# Patient Record
Sex: Male | Born: 1985 | Race: Black or African American | Hispanic: No | Marital: Single | Smoking: Current every day smoker
Health system: Southern US, Community
[De-identification: ages and names within clinical notes are randomized; demographics above are authoritative.]

## PROBLEM LIST (undated history)

## (undated) DIAGNOSIS — F329 Major depressive disorder, single episode, unspecified: Secondary | ICD-10-CM

## (undated) DIAGNOSIS — F32A Depression, unspecified: Secondary | ICD-10-CM

## (undated) DIAGNOSIS — N289 Disorder of kidney and ureter, unspecified: Secondary | ICD-10-CM

## (undated) DIAGNOSIS — I82409 Acute embolism and thrombosis of unspecified deep veins of unspecified lower extremity: Secondary | ICD-10-CM

## (undated) DIAGNOSIS — I10 Essential (primary) hypertension: Secondary | ICD-10-CM

## (undated) DIAGNOSIS — D573 Sickle-cell trait: Secondary | ICD-10-CM

## (undated) HISTORY — PX: COLON SURGERY: SHX602

## (undated) HISTORY — PX: CHOLECYSTECTOMY: SHX55

## (undated) HISTORY — PX: RENAL BIOPSY: SHX156

---

## 2018-02-07 ENCOUNTER — Ambulatory Visit: Payer: Medicaid Other

## 2018-02-07 ENCOUNTER — Encounter: Payer: Self-pay | Admitting: Gynecology

## 2018-02-07 ENCOUNTER — Ambulatory Visit
Admission: EM | Admit: 2018-02-07 | Discharge: 2018-02-07 | Disposition: A | Discharge status: Home / Self Care | Payer: Medicaid Other | Attending: Family Medicine | Admitting: Family Medicine

## 2018-02-07 ENCOUNTER — Other Ambulatory Visit: Payer: Self-pay

## 2018-02-07 DIAGNOSIS — W1839XA Other fall on same level, initial encounter: Secondary | ICD-10-CM | POA: Diagnosis not present

## 2018-02-07 DIAGNOSIS — L02811 Cutaneous abscess of head [any part, except face]: Secondary | ICD-10-CM | POA: Diagnosis not present

## 2018-02-07 DIAGNOSIS — W19XXXA Unspecified fall, initial encounter: Secondary | ICD-10-CM | POA: Insufficient documentation

## 2018-02-07 DIAGNOSIS — M25562 Pain in left knee: Secondary | ICD-10-CM | POA: Diagnosis present

## 2018-02-07 DIAGNOSIS — S83422A Sprain of lateral collateral ligament of left knee, initial encounter: Secondary | ICD-10-CM

## 2018-02-07 DIAGNOSIS — F17200 Nicotine dependence, unspecified, uncomplicated: Secondary | ICD-10-CM | POA: Insufficient documentation

## 2018-02-07 HISTORY — DX: Essential (primary) hypertension: I10

## 2018-02-07 HISTORY — DX: Acute embolism and thrombosis of unspecified deep veins of unspecified lower extremity: I82.409

## 2018-02-07 HISTORY — DX: Sickle-cell trait: D57.3

## 2018-02-07 HISTORY — DX: Disorder of kidney and ureter, unspecified: N28.9

## 2018-02-07 HISTORY — DX: Depression, unspecified: F32.A

## 2018-02-07 HISTORY — DX: Major depressive disorder, single episode, unspecified: F32.9

## 2018-02-07 MED ORDER — MELOXICAM 15 MG PO TABS: 15.0000 mg | ORAL_TABLET | Freq: Every day | ORAL | 0 refills | Status: AC | PRN

## 2018-02-07 MED ORDER — DOXYCYCLINE HYCLATE 100 MG PO CAPS: 100.0000 mg | ORAL_CAPSULE | Freq: Two times a day (BID) | ORAL | 0 refills | Status: DC

## 2018-02-07 NOTE — ED Provider Notes (Addendum)
[facility]    CSN: [id number] Arrival date & time: 02/07/18  1719  History   Chief Complaint Chief Complaint  Patient presents with  . Knee Injury  . Abscess   HPI  32 year old male presents with 2 separate complaints.  Patient reports that he was in the street last night.  He fell and injured his left knee.  He reports that he felt a pop.  His pain is located laterally.  Worse with activity.  No relieving factors.  Additionally, patient reports an ongoing abscess to the right posterior aspect of his scalp.  Has a history of dissecting cellulitis of the scalp.  Has been seen previously by dermatology.  States that this particular abscess has been present for the past several days.  Area is tender and painful.  Patient would like it drained today.  Social Hx reviewed and updated as below. Social History Social History   Tobacco Use  . Smoking status: Current Every Day Smoker  . Smokeless tobacco: Never Used  Substance Use Topics  . Alcohol use: Yes  . Drug use: Never     Allergies   Patient has no known allergies.   Review of Systems Review of Systems  Musculoskeletal:       Left knee pain.  Skin:       Abscess.    Physical Exam Triage Vital Signs ED Triage Vitals  Enc Vitals Group     BP 02/07/18 1734 115/69     Pulse Rate 02/07/18 1734 100     Resp 02/07/18 1734 16     Temp 02/07/18 1734 98.9 F (37.2 C)     Temp Source 02/07/18 1734 Oral     SpO2 02/07/18 1734 100 %     Weight 02/07/18 1733 186 lb (84.4 kg)     Height 02/07/18 1733 6\' 1"  (1.854 m)     Head Circumference --      Peak Flow --      Pain Score 02/07/18 1732 9     Pain Loc --      Pain Edu? --      Excl. in GC? --    Updated Vital Signs BP 115/69 (BP Location: Left Arm)   Pulse 100   Temp 98.9 F (37.2 C) (Oral)   Resp 16   Ht 6\' 1"  (1.854 m)   Wt 84.4 kg   SpO2 100%   BMI 24.54 kg/m   Visual Acuity Right Eye Distance:   Left Eye Distance:   Bilateral  Distance:    Right Eye Near:   Left Eye Near:    Bilateral Near:     Physical Exam  Constitutional: He is oriented to person, place, and time. He appears well-developed. No distress.  HENT:  Head: Normocephalic and atraumatic.    1.5 cm circumferential raised area with fluctuance noted at the labeled location.  Eyes: Conjunctivae are normal. Right eye exhibits no discharge. Left eye exhibits no discharge.  Pulmonary/Chest: Effort normal. No respiratory distress.  Musculoskeletal:  Left knee - Lateral tenderness. No effusion. Ligaments intact.  Neurological: He is alert and oriented to person, place, and time.  Psychiatric: His behavior is normal.  Flat affect.  Nursing note and vitals reviewed.  UC Treatments / Results  Labs (all labs ordered are listed, but only abnormal results are displayed) Labs Reviewed - No data to display  EKG None  Radiology No results found.  Procedures Incision and Drainage Date/Time: 02/07/2018 6:25 PM Performed by: Adriana Simas,  Verdis Frederickson, DO Authorized by: Tommie Sams, DO   Consent:    Consent obtained:  Verbal   Consent given by:  Patient Location:    Type:  Abscess   Size:  1.5 cm   Location:  Head   Head location:  Scalp Pre-procedure details:    Skin preparation:  Betadine Anesthesia (see MAR for exact dosages):    Anesthesia method:  Local infiltration   Local anesthetic:  Lidocaine 1% WITH epi Procedure type:    Complexity:  Simple Procedure details:    Incision types:  Stab incision   Scalpel blade:  11   Drainage:  Purulent   Drainage amount:  Moderate   Wound treatment:  Wound left open   Packing materials:  None Post-procedure details:    Patient tolerance of procedure:  Tolerated well, no immediate complications   (including critical care time)  Medications Ordered in UC Medications - No data to display  Initial Impression / Assessment and Plan / UC Course  I have reviewed the triage vital signs and the nursing  notes.  Pertinent labs & imaging results that were available during my care of the patient were reviewed by me and considered in my medical decision making (see chart for details).    32 year old male presents with scalp abscess/dissecting cellulitis of the scalp, and LCL sprain.  X-ray negative.  Treating with meloxicam and doxycycline.  Incision and drainage performed as above.  Advised to follow-up with dermatology.  Is likely going to be a candidate for Accutane.  Final Clinical Impressions(s) / UC Diagnoses   Final diagnoses:  Scalp abscess  Sprain of lateral collateral ligament of left knee, initial encounter     Discharge Instructions     Knee xray normal. Medication as prescribed.  Doxy as prescribed.  Follow up with Dermatology.  Take care  Dr. Adriana Simas     ED Prescriptions    Medication Sig Dispense Auth. Provider   doxycycline (VIBRAMYCIN) 100 MG capsule Take 1 capsule (100 mg total) by mouth 2 (two) times daily. Take with food. 60 capsule Desirae Mancusi G, DO   meloxicam (MOBIC) 15 MG tablet Take 1 tablet (15 mg total) by mouth daily as needed. 30 tablet Tommie Sams, DO     Controlled Substance Prescriptions [place] Controlled Substance Registry consulted? Not Applicable   Tommie Sams, DO 02/07/18 1824    Tommie Sams, DO 02/07/18 Claria Dice

## 2018-02-07 NOTE — ED Triage Notes (Signed)
Per patient x yesterday slip and fell in drainage at sidewalk . Pt. Stated he felt  his left knee popped. Patient also c/o abscess at side of his right head.

## 2018-02-07 NOTE — Discharge Instructions (Signed)
Knee xray normal. Medication as prescribed.  Doxy as prescribed.  Follow up with Dermatology.  Take care  Dr. Adriana Simas

## 2018-04-21 ENCOUNTER — Ambulatory Visit
Admission: EM | Admit: 2018-04-21 | Discharge: 2018-04-21 | Disposition: A | Discharge status: Home / Self Care | Payer: Medicaid Other | Attending: Emergency Medicine | Admitting: Emergency Medicine

## 2018-04-21 ENCOUNTER — Other Ambulatory Visit: Payer: Self-pay

## 2018-04-21 ENCOUNTER — Encounter: Payer: Self-pay | Admitting: Gynecology

## 2018-04-21 DIAGNOSIS — M5412 Radiculopathy, cervical region: Secondary | ICD-10-CM | POA: Insufficient documentation

## 2018-04-21 MED ORDER — TIZANIDINE HCL 4 MG PO CAPS: 4.0000 mg | ORAL_CAPSULE | Freq: Three times a day (TID) | ORAL | 0 refills | Status: AC

## 2018-04-21 MED ORDER — METHYLPREDNISOLONE 4 MG PO TBPK: ORAL_TABLET | ORAL | 0 refills | Status: DC

## 2018-04-21 NOTE — ED Provider Notes (Signed)
[facility]    CSN: [id number] Arrival date & time: 04/21/18  1547     History   Chief Complaint No chief complaint on file.   HPI Doris CheadleFranklin Bloodsworth is a 33 y.o. male.   HPI    33 year old male presents with right-sided neck pain radiating into his shoulder and his right upper extremity.  States he had it for months.  He thinks it occurred when he was at [facility] and they stuck him 7 times to get an IV started.  It hurts to move his arm.  He does not complain of any numbness or tingling.           Past Medical History:  Diagnosis Date  . Depression   . DVT (deep venous thrombosis) (HCC)   . Hypertension   . Renal disorder   . Sickle cell trait (HCC)     There are no active problems to display for this patient.   Past Surgical History:  Procedure Laterality Date  . CHOLECYSTECTOMY    . COLON SURGERY    . RENAL BIOPSY         Home Medications    Prior to Admission medications   Medication Sig Start Date End Date Taking? Authorizing Provider  FLUoxetine (PROZAC) 10 MG capsule TAKE ONE CAPSULE BY MOUTH EVERY DAY FOR 7 DAYS AND THEN TAKE TWO CAPSULES EVERY DAY THEREAFTER 01/21/18  Yes [provider]  mirtazapine (REMERON) 15 MG tablet TAKE ONE TABLET BY MOUTH EVERY NIGHT AT BEDTIME AS NEEDED FOR INSOMNIA 01/21/18  Yes [provider]  SUBOXONE 8-2 MG FILM DISSOLVE ONE FILM UNDER THE TONGUE THREE TIMES A DAY 01/21/18  Yes [provider]  meloxicam (MOBIC) 15 MG tablet Take 1 tablet (15 mg total) by mouth daily as needed. 02/07/18   Tommie Samsook, Jayce G, DO  methylPREDNISolone (MEDROL DOSEPAK) 4 MG TBPK tablet Take per package instructions 04/21/18   Lutricia Feiloemer, Kameshia Madruga P, PA-C  tiZANidine (ZANAFLEX) 4 MG capsule Take 1 capsule (4 mg total) by mouth 3 (three) times daily. 04/21/18   Lutricia Feiloemer, Laquinta Hazell P, PA-C    Family History Family History  Problem Relation Age of Onset  . Hypertension Mother   . Asthma Father     Social  History Social History   Tobacco Use  . Smoking status: Current Every Day Smoker  . Smokeless tobacco: Never Used  Substance Use Topics  . Alcohol use: Yes  . Drug use: Never     Allergies   Tomato   Review of Systems Review of Systems  Constitutional: Positive for activity change. Negative for appetite change, chills, fatigue and fever.  Musculoskeletal: Positive for neck pain and neck stiffness.  All other systems reviewed and are negative.    Physical Exam Triage Vital Signs ED Triage Vitals  Enc Vitals Group     BP 04/21/18 1605 106/66     Pulse Rate 04/21/18 1605 96     Resp 04/21/18 1605 16     Temp 04/21/18 1605 98.1 F (36.7 C)     Temp Source 04/21/18 1605 Oral     SpO2 04/21/18 1605 98 %     Weight 04/21/18 1604 200 lb (90.7 kg)     Height 04/21/18 1604 6\' 1"  (1.854 m)     Head Circumference --      Peak Flow --      Pain Score 04/21/18 1604 10     Pain Loc --      Pain Edu? --  Excl. in GC? --    No data found.  Updated Vital Signs BP 106/66 (BP Location: Left Arm)   Pulse 96   Temp 98.1 F (36.7 C) (Oral)   Resp 16   Ht 6\' 1"  (1.854 m)   Wt 200 lb (90.7 kg)   SpO2 98%   BMI 26.39 kg/m   Visual Acuity Right Eye Distance:   Left Eye Distance:   Bilateral Distance:    Right Eye Near:   Left Eye Near:    Bilateral Near:     Physical Exam Vitals signs and nursing note reviewed.  Constitutional:      General: He is not in acute distress.    Appearance: Normal appearance. He is normal weight. He is not ill-appearing, toxic-appearing or diaphoretic.  HENT:     Head: Normocephalic.     Nose: Nose normal.     Mouth/Throat:     Mouth: Mucous membranes are moist.     Pharynx: No oropharyngeal exudate or posterior oropharyngeal erythema.  Eyes:     General:        Right eye: No discharge.        Left eye: No discharge.     Conjunctiva/sclera: Conjunctivae normal.  Neck:     Musculoskeletal: Muscular tenderness present.      Comments: Patient has decreased range of motion tickly with flexion extension and rightward rotation.  Tenderness to palpation of the trapezii with a palpable muscle spasm.  This extend into the intrascapular area on the right.  Her extremity strength and sensation are intact to light touch and clinical resistance.  DTRs are 1/4 but symmetrical Musculoskeletal: Normal range of motion.  Lymphadenopathy:     Cervical: No cervical adenopathy.  Skin:    General: Skin is warm and dry.  Neurological:     General: No focal deficit present.     Mental Status: He is alert and oriented to person, place, and time.  Psychiatric:        Mood and Affect: Mood normal.        Behavior: Behavior normal.        Thought Content: Thought content normal.        Judgment: Judgment normal.      UC Treatments / Results  Labs (all labs ordered are listed, but only abnormal results are displayed) Labs Reviewed - No data to display  EKG None  Radiology No results found.  Procedures Procedures (including critical care time)  Medications Ordered in UC Medications - No data to display  Initial Impression / Assessment and Plan / UC Course  I have reviewed the triage vital signs and the nursing notes.  Pertinent labs & imaging results that were available during my care of the patient were reviewed by me and considered in my medical decision making (see chart for details).   Patient has cervical radiculopathy on the right.  Spasm in the cervical spine muscles and trapezial muscles.  No neurological loss at this point.  She has chronic glomerulonephritis  and cannot use nonsteroidal anti-inflammatory drugs.  Scribe a Medrol Dosepak.  Describe Zanaflex for muscle relaxation.  He may use ice 20 minutes every 2 hours 4-5 times daily and then alternate with heat as necessary.  Have encouraged him to follow-up with his primary care physician next week.   Final Clinical Impressions(s) / UC Diagnoses   Final  diagnoses:  Cervical radiculitis     Discharge Instructions     Apply ice 20 minutes  out of every 2 hours 4-5 times daily for comfort.    ED Prescriptions    Medication Sig Dispense Auth. Provider   methylPREDNISolone (MEDROL DOSEPAK) 4 MG TBPK tablet Take per package instructions 21 tablet Ovid Curdoemer, Skeeter Sheard P, PA-C   tiZANidine (ZANAFLEX) 4 MG capsule Take 1 capsule (4 mg total) by mouth 3 (three) times daily. 21 capsule Lutricia Feiloemer, Vahan Wadsworth P, PA-C     Controlled Substance Prescriptions [place] Controlled Substance Registry consulted? Not Applicable   Lutricia FeilRoemer, Damira Kem P, PA-C 04/21/18 1654

## 2018-04-21 NOTE — ED Triage Notes (Signed)
Per patient c/o nerve pain on right side neck to shoulder x 1 month.

## 2018-04-21 NOTE — Discharge Instructions (Addendum)
Apply ice 20 minutes out of every 2 hours 4-5 times daily for comfort.  °

## 2018-06-07 ENCOUNTER — Other Ambulatory Visit: Payer: Self-pay

## 2018-06-07 ENCOUNTER — Ambulatory Visit
Admission: EM | Admit: 2018-06-07 | Discharge: 2018-06-07 | Discharge status: Against Medical Advice | Payer: Medicaid Other

## 2018-06-07 NOTE — ED Triage Notes (Signed)
Patient complains of bumps to his head. Patient states area hurts. Patient states that he has had abscess to head before.

## 2018-07-13 ENCOUNTER — Ambulatory Visit
Admission: EM | Admit: 2018-07-13 | Discharge: 2018-07-13 | Disposition: A | Discharge status: Home / Self Care | Payer: Medicaid Other | Attending: Family Medicine | Admitting: Family Medicine

## 2018-07-13 ENCOUNTER — Other Ambulatory Visit: Payer: Self-pay

## 2018-07-13 DIAGNOSIS — L02811 Cutaneous abscess of head [any part, except face]: Secondary | ICD-10-CM

## 2018-07-13 MED ORDER — DOXYCYCLINE HYCLATE 100 MG PO CAPS: 100.0000 mg | ORAL_CAPSULE | Freq: Two times a day (BID) | ORAL | 0 refills | Status: AC

## 2018-07-13 NOTE — ED Notes (Signed)
4x4 and coban to head

## 2018-07-13 NOTE — ED Triage Notes (Signed)
Pt states he was going to Dermatology at [facility] for cysts on scalp but they pushed his appointment back. Multiple areas to scalp raised and painful.

## 2018-07-13 NOTE — Discharge Instructions (Signed)
Antibiotic as prescribed.  See Dermatology.  Take care  Dr. Adriana Simas

## 2018-07-14 DIAGNOSIS — L02811 Cutaneous abscess of head [any part, except face]: Secondary | ICD-10-CM | POA: Diagnosis not present

## 2018-07-14 NOTE — ED Provider Notes (Signed)
[facility]    CSN: [id number] Arrival date & time: 07/13/18  1539  History   Chief Complaint Chief Complaint  Patient presents with  . Cyst   HPI  33 year old male presents with a "cyst" on his scalp.  Patient is followed by dermatology.  Patient has known dissecting cellulitis of the scalp.  Patient has scalp abscesses quite often.  He has been treated with antibiotics as well as corticosteroid injections.  Patient states that he has an area near his crown that is painful, swollen.  He believes he has an abscess that needs to be drained.  Patient has multiple other areas but they do not appear to be amenable to drainage.  Patient states that his pain is quite severe.  No relieving factors.  No other associated symptoms.    History reviewed as below. Past Medical History:  Diagnosis Date  . Depression   . DVT (deep venous thrombosis) (HCC)   . Hypertension   . Renal disorder   . Sickle cell trait (HCC)   Tobacco abuse Dissecting cellulitis of the scalp Polysubstance abuse  Past Surgical History:  Procedure Laterality Date  . CHOLECYSTECTOMY    . COLON SURGERY    . RENAL BIOPSY     Home Medications    Prior to Admission medications   Medication Sig Start Date End Date Taking? Authorizing Provider  clindamycin (CLEOCIN T) 1 % external solution Apply topically. 01/09/18 01/09/19  [provider]  doxycycline (VIBRAMYCIN) 100 MG capsule Take 1 capsule (100 mg total) by mouth 2 (two) times daily. 07/13/18   Ramaj Frangos, Dorie Rank G, DO  FLUoxetine (PROZAC) 10 MG capsule TAKE ONE CAPSULE BY MOUTH EVERY DAY FOR 7 DAYS AND THEN TAKE TWO CAPSULES EVERY DAY THEREAFTER 01/21/18   [provider]  meloxicam (MOBIC) 15 MG tablet Take 1 tablet (15 mg total) by mouth daily as needed. 02/07/18   Suhaas Agena, Dorie Rank G, DO  mirtazapine (REMERON) 15 MG tablet TAKE ONE TABLET BY MOUTH EVERY NIGHT AT BEDTIME AS NEEDED FOR INSOMNIA 01/21/18   [provider]  prazosin  (MINIPRESS) 1 MG capsule TAKE 1-3 CAPSULES BY MOUTH AS NEEDED FOR INSOMNIA 10/15/17   [provider]  SUBOXONE 8-2 MG FILM DISSOLVE ONE FILM UNDER THE TONGUE THREE TIMES A DAY 01/21/18   [provider]  tiZANidine (ZANAFLEX) 4 MG capsule Take 1 capsule (4 mg total) by mouth 3 (three) times daily. 04/21/18   Lutricia Feil, PA-C    Family History Family History  Problem Relation Age of Onset  . Hypertension Mother   . Asthma Father     Social History Social History   Tobacco Use  . Smoking status: Current Every Day Smoker    Packs/day: 0.50    Types: Cigarettes  . Smokeless tobacco: Never Used  Substance Use Topics  . Alcohol use: Not Currently  . Drug use: Yes    Types: Marijuana    Comment: last use today     Allergies   Tomato   Review of Systems Review of Systems   Physical Exam Triage Vital Signs ED Triage Vitals  Enc Vitals Group     BP 07/13/18 1549 114/72     Pulse Rate 07/13/18 1549 79     Resp 07/13/18 1549 16     Temp 07/13/18 1549 97.9 F (36.6 C)     Temp Source 07/13/18 1549 Oral     SpO2 07/13/18 1549 98 %     Weight 07/13/18 1551  184 lb 15.5 oz (83.9 kg)     Height 07/13/18 1551 6\' 1"  (1.854 m)     Head Circumference --      Peak Flow --      Pain Score 07/13/18 1551 9     Pain Loc --      Pain Edu? --      Excl. in GC? --    No data found.  Updated Vital Signs BP 114/72 (BP Location: Left Arm)   Pulse 79   Temp 97.9 F (36.6 C) (Oral)   Resp 16   Ht 6\' 1"  (1.854 m)   Wt 83.9 kg   SpO2 98%   BMI 24.40 kg/m   Visual Acuity Right Eye Distance:   Left Eye Distance:   Bilateral Distance:    Right Eye Near:   Left Eye Near:    Bilateral Near:     Physical Exam Vitals signs and nursing note reviewed.  Constitutional:      General: He is not in acute distress.    Appearance: Normal appearance.  HENT:     Head: Normocephalic and atraumatic.  Eyes:     General: No scleral icterus.       Right eye: No  discharge.        Left eye: No discharge.     Conjunctiva/sclera: Conjunctivae normal.  Pulmonary:     Effort: Pulmonary effort is normal. No respiratory distress.  Skin:    Comments: Patient has multiple slightly raised areas on his scalp with overlying alopecia.  Patient is a larger raised area near the crown which is exquisitely tender to palpation.  Fluctuant.  No active drainage.  Neurological:     Mental Status: He is alert.  Psychiatric:     Comments: Flat affect. Slow to respond.     UC Treatments / Results  Labs (all labs ordered are listed, but only abnormal results are displayed) Labs Reviewed - No data to display  EKG None  Radiology No results found.  Procedures Incision and Drainage Date/Time: 07/14/2018 8:08 AM Performed by: Tommie Sams, DO Authorized by: Tommie Sams, DO   Consent:    Consent obtained:  Verbal   Consent given by:  Patient Location:    Type:  Abscess   Location:  Head   Head location:  Scalp Pre-procedure details:    Skin preparation:  Betadine Anesthesia (see MAR for exact dosages):    Anesthesia method:  Local infiltration   Local anesthetic:  Lidocaine 1% w/o epi Procedure type:    Complexity:  Simple Procedure details:    Incision types:  Stab incision   Scalpel blade:  11   Drainage:  Purulent and bloody   Drainage amount:  Copious   Wound treatment:  Wound left open Post-procedure details:    Patient tolerance of procedure:  Tolerated well, no immediate complications   (including critical care time)  Medications Ordered in UC Medications - No data to display  Initial Impression / Assessment and Plan / UC Course  I have reviewed the triage vital signs and the nursing notes.  Pertinent labs & imaging results that were available during my care of the patient were reviewed by me and considered in my medical decision making (see chart for details).    33 year old male with a history of dissecting cellulitis of the  scalp presents with a scalp abscess.  Incision and drainage performed today.  Advised to follow-up with dermatology.  Placed on doxycycline.  Final  Clinical Impressions(s) / UC Diagnoses   Final diagnoses:  Scalp abscess     Discharge Instructions     Antibiotic as prescribed.  See Dermatology.  Take care  Dr. Adriana Simas    ED Prescriptions    Medication Sig Dispense Auth. Provider   doxycycline (VIBRAMYCIN) 100 MG capsule Take 1 capsule (100 mg total) by mouth 2 (two) times daily. 20 capsule Tommie Sams, DO     Controlled Substance Prescriptions [place] Controlled Substance Registry consulted? Not Applicable   Tommie Sams, DO 07/14/18 [id number]

## 2019-05-26 IMAGING — CR DG KNEE COMPLETE 4+V*L*
4 series · 4 of 4 positions shown · non-contrast
Comparison: None.

CLINICAL DATA: Fall last night, knee injury.

EXAM:
LEFT KNEE - COMPLETE 4+ VIEW

[knee ap]
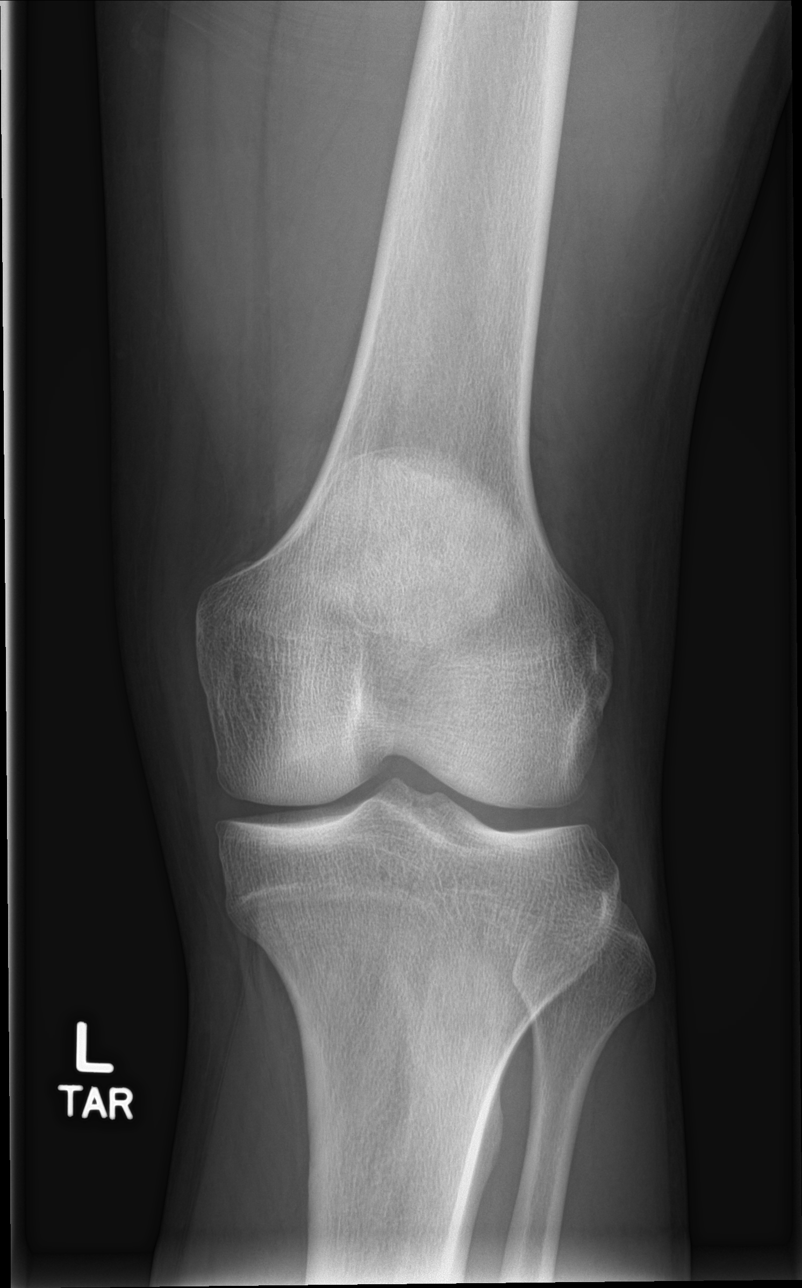

[knee lat]
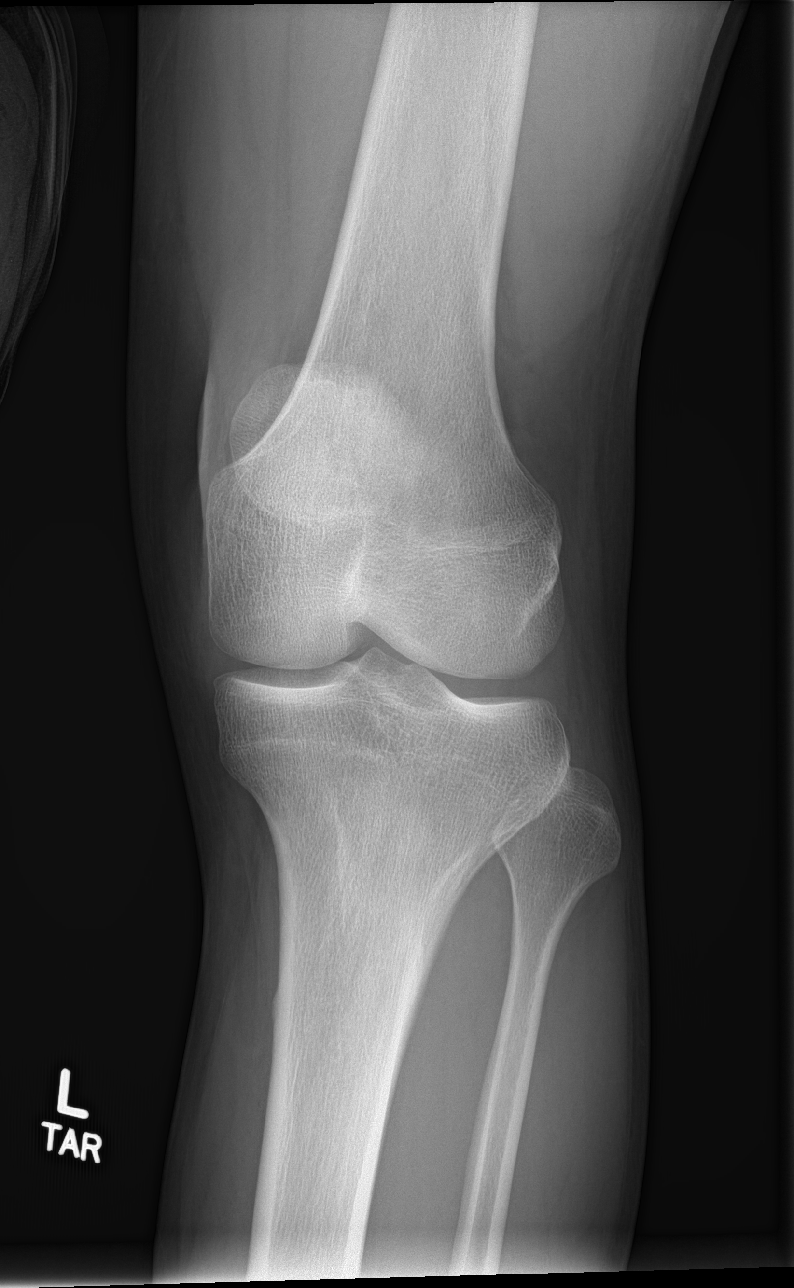

[tunnel]
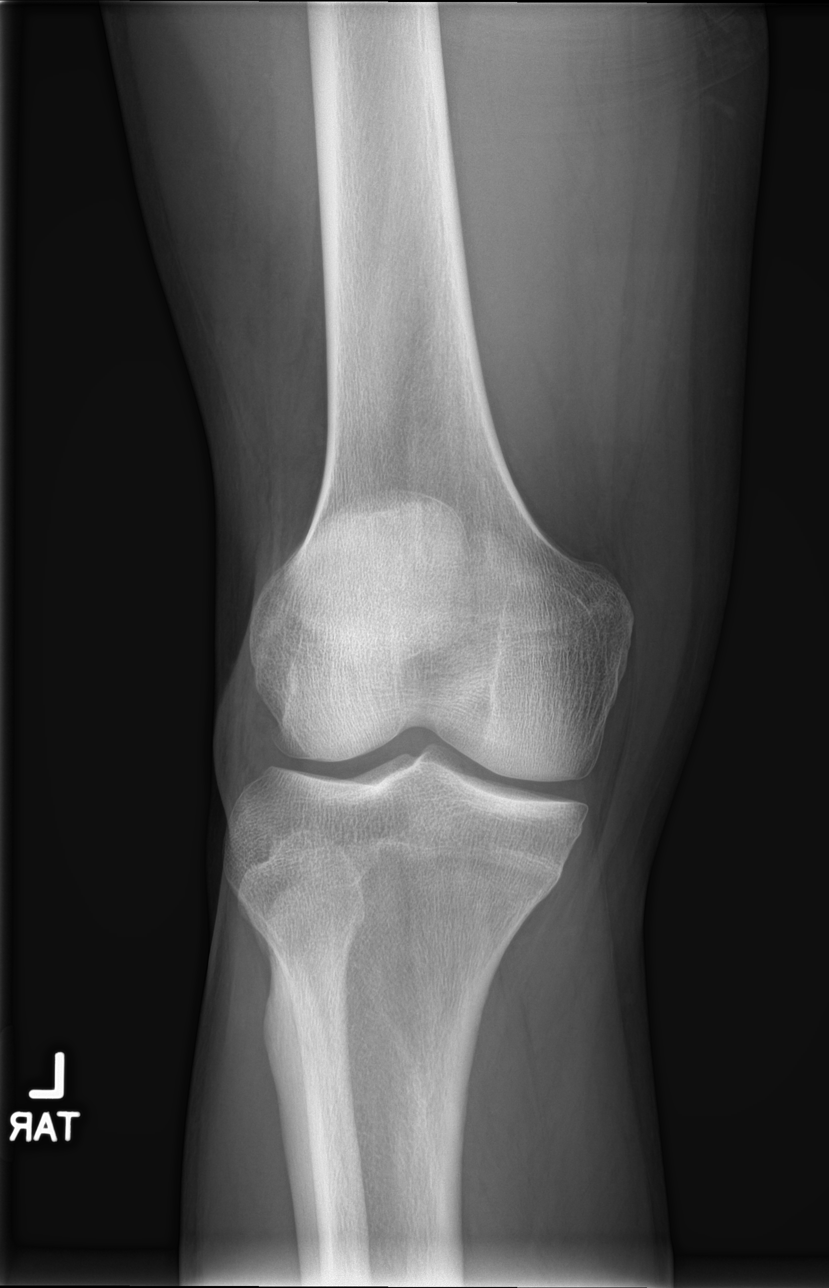

[patella skyline]
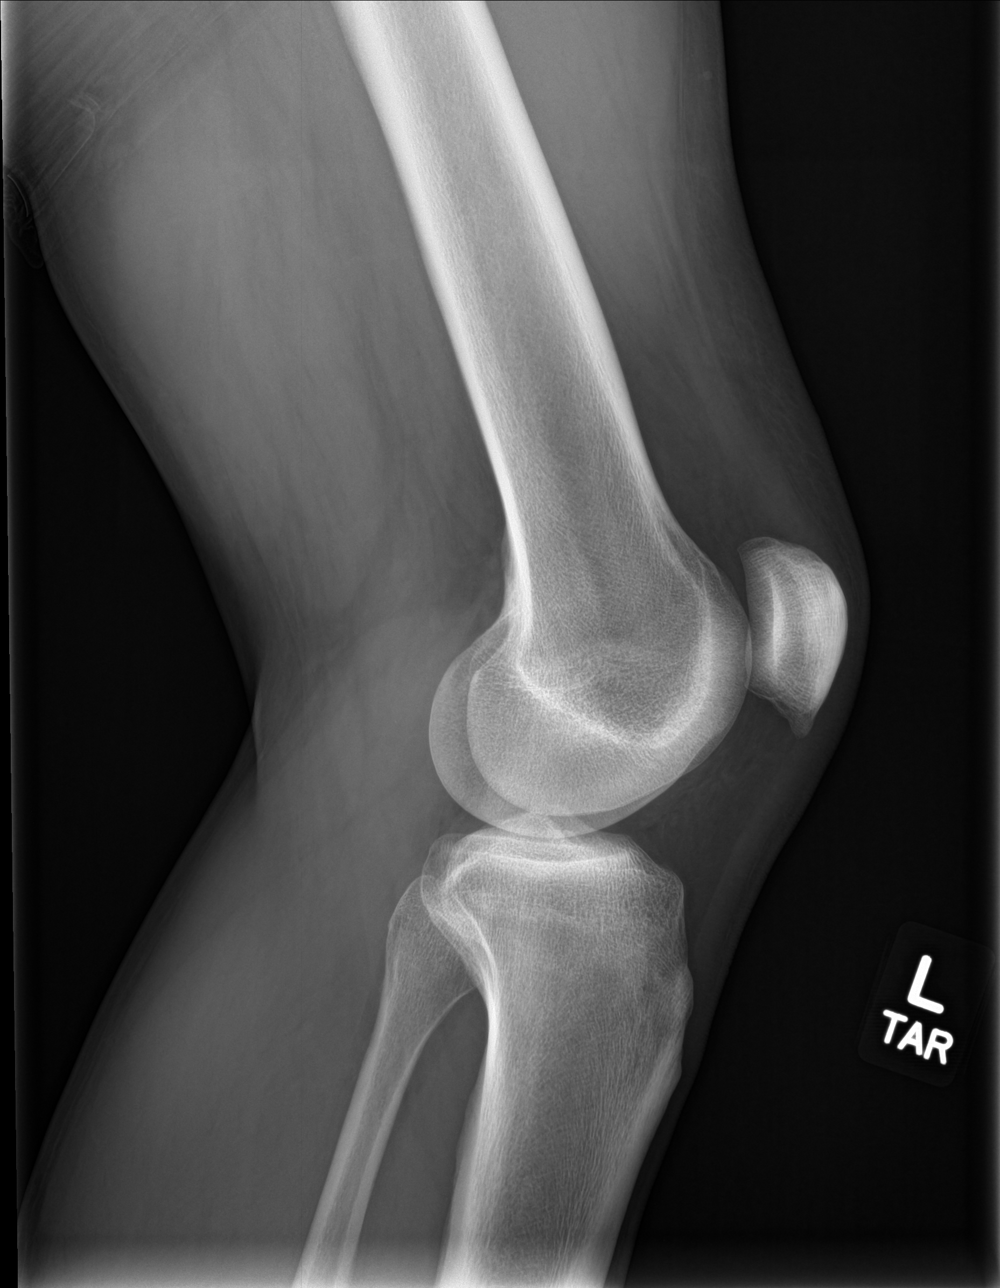

[4 of 4 positions shown; findings below may reference images not displayed]

FINDINGS: No evidence of fracture, dislocation, or joint effusion. No evidence
of arthropathy or other focal bone abnormality. Soft tissues are
unremarkable.
IMPRESSION: Negative.
